# Patient Record
Sex: Female | Born: 1985 | Hispanic: Yes | Marital: Married | Smoking: Never smoker
Health system: Southern US, Community
[De-identification: ages and names within clinical notes are randomized; demographics above are authoritative.]

---

## 2006-10-30 ENCOUNTER — Encounter: Payer: Self-pay | Admitting: Maternal & Fetal Medicine

## 2006-11-07 ENCOUNTER — Observation Stay: Payer: Self-pay

## 2006-11-18 ENCOUNTER — Other Ambulatory Visit: Payer: Self-pay

## 2006-11-18 ENCOUNTER — Observation Stay: Payer: Self-pay | Admitting: Unknown Physician Specialty

## 2007-01-21 ENCOUNTER — Observation Stay: Payer: Self-pay | Admitting: Obstetrics & Gynecology

## 2007-01-25 ENCOUNTER — Observation Stay: Payer: Self-pay | Admitting: Obstetrics & Gynecology

## 2007-01-29 ENCOUNTER — Observation Stay: Payer: Self-pay

## 2007-01-31 ENCOUNTER — Inpatient Hospital Stay: Payer: Self-pay | Admitting: Unknown Physician Specialty

## 2008-08-16 ENCOUNTER — Emergency Department: Payer: Self-pay | Admitting: Emergency Medicine

## 2008-09-05 ENCOUNTER — Emergency Department: Payer: Self-pay | Admitting: Unknown Physician Specialty

## 2009-04-29 ENCOUNTER — Ambulatory Visit: Payer: Self-pay

## 2009-06-18 ENCOUNTER — Observation Stay: Payer: Self-pay

## 2009-06-19 ENCOUNTER — Ambulatory Visit: Payer: Self-pay | Admitting: Unknown Physician Specialty

## 2009-06-19 ENCOUNTER — Observation Stay: Payer: Self-pay | Admitting: Unknown Physician Specialty

## 2009-06-30 ENCOUNTER — Observation Stay: Payer: Self-pay

## 2009-07-22 ENCOUNTER — Ambulatory Visit: Payer: Self-pay | Admitting: Obstetrics and Gynecology

## 2009-07-23 ENCOUNTER — Inpatient Hospital Stay: Payer: Self-pay | Admitting: Obstetrics & Gynecology

## 2010-07-26 ENCOUNTER — Emergency Department: Payer: Self-pay | Admitting: Emergency Medicine

## 2011-01-03 ENCOUNTER — Emergency Department: Payer: Self-pay | Admitting: Unknown Physician Specialty

## 2011-09-23 ENCOUNTER — Emergency Department: Payer: Self-pay | Admitting: Unknown Physician Specialty

## 2011-09-23 LAB — CBC WITH DIFFERENTIAL/PLATELET
Basophil #: 0 10*3/uL (ref 0.0–0.1)
Basophil %: 0.4 %
Eosinophil #: 0.3 10*3/uL (ref 0.0–0.7)
Monocyte #: 0.3 x10 3/mm (ref 0.2–0.9)
Neutrophil #: 5 10*3/uL (ref 1.4–6.5)
Neutrophil %: 61.2 %

## 2011-09-23 LAB — COMPREHENSIVE METABOLIC PANEL
Alkaline Phosphatase: 74 U/L (ref 50–136)
BUN: 9 mg/dL (ref 7–18)
Bilirubin,Total: 0.7 mg/dL (ref 0.2–1.0)
Chloride: 104 mmol/L (ref 98–107)
Co2: 29 mmol/L (ref 21–32)
EGFR (African American): >60
EGFR (Non-African Amer.): >60
Potassium: 3.7 mmol/L (ref 3.5–5.1)
SGOT(AST): 24 U/L (ref 15–37)
Sodium: 141 mmol/L (ref 136–145)

## 2011-10-26 ENCOUNTER — Ambulatory Visit: Payer: Self-pay | Admitting: Gastroenterology

## 2011-10-26 LAB — PREGNANCY, URINE: Pregnancy Test, Urine: NEGATIVE m[IU]/mL

## 2012-04-16 ENCOUNTER — Emergency Department: Payer: Self-pay | Admitting: Emergency Medicine

## 2012-10-12 ENCOUNTER — Emergency Department: Payer: Self-pay | Admitting: Emergency Medicine

## 2013-07-24 ENCOUNTER — Emergency Department: Payer: Self-pay | Admitting: Emergency Medicine

## 2014-07-03 ENCOUNTER — Emergency Department: Payer: Self-pay | Admitting: Emergency Medicine

## 2015-08-14 ENCOUNTER — Emergency Department
Admission: EM | Admit: 2015-08-14 | Discharge: 2015-08-14 | Disposition: A | Discharge status: Home / Self Care | Payer: Self-pay | Attending: Emergency Medicine | Admitting: Emergency Medicine

## 2015-08-14 ENCOUNTER — Encounter: Payer: Self-pay | Admitting: *Deleted

## 2015-08-14 ENCOUNTER — Emergency Department: Payer: Self-pay

## 2015-08-14 DIAGNOSIS — K5901 Slow transit constipation: Secondary | ICD-10-CM | POA: Insufficient documentation

## 2015-08-14 LAB — COMPREHENSIVE METABOLIC PANEL
ALK PHOS: 49 U/L (ref 38–126)
ALT: 16 U/L (ref 14–54)
AST: 34 U/L (ref 15–41)
Albumin: 4.1 g/dL (ref 3.5–5.0)
Anion gap: 7 (ref 5–15)
BILIRUBIN TOTAL: 0.5 mg/dL (ref 0.3–1.2)
BUN: 10 mg/dL (ref 6–20)
CALCIUM: 9 mg/dL (ref 8.9–10.3)
CO2: 26 mmol/L (ref 22–32)
CREATININE: 0.82 mg/dL (ref 0.44–1.00)
Chloride: 104 mmol/L (ref 101–111)
Glucose, Bld: 90 mg/dL (ref 65–99)
Potassium: 3.6 mmol/L (ref 3.5–5.1)
Sodium: 137 mmol/L (ref 135–145)
Total Protein: 7.6 g/dL (ref 6.5–8.1)

## 2015-08-14 LAB — CBC
HEMATOCRIT: 31.1 % — AB (ref 35.0–47.0)
HEMOGLOBIN: 10.3 g/dL — AB (ref 12.0–16.0)
MCH: 22 pg — AB (ref 26.0–34.0)
MCHC: 33 g/dL (ref 32.0–36.0)
MCV: 66.6 fL — AB (ref 80.0–100.0)
Platelets: 381 10*3/uL (ref 150–440)
RBC: 4.67 MIL/uL (ref 3.80–5.20)
RDW: 18.9 % — ABNORMAL HIGH (ref 11.5–14.5)
WBC: 10.1 10*3/uL (ref 3.6–11.0)

## 2015-08-14 LAB — POCT PREGNANCY, URINE: Preg Test, Ur: NEGATIVE

## 2015-08-14 LAB — LIPASE, BLOOD: LIPASE: 33 U/L (ref 11–51)

## 2015-08-14 MED ORDER — MAGNESIUM CITRATE PO SOLN
1.0000 | Freq: Once | ORAL | Status: AC
  Administered 2015-08-14: 1 via ORAL

## 2015-08-14 MED ORDER — ONDANSETRON HCL 4 MG/2ML IJ SOLN
4.0000 mg | Freq: Once | INTRAMUSCULAR | Status: AC
  Administered 2015-08-14: 4 mg via INTRAVENOUS

## 2015-08-14 MED ORDER — MAGNESIUM CITRATE PO SOLN
ORAL | Status: AC
  Administered 2015-08-14: 1 via ORAL
  Filled 2015-08-14: qty 296

## 2015-08-14 MED ORDER — FLEET ENEMA 7-19 GM/118ML RE ENEM
1.0000 | ENEMA | Freq: Once | RECTAL | Status: AC
  Administered 2015-08-14: 1 via RECTAL

## 2015-08-14 MED ORDER — TRAMADOL HCL 50 MG PO TABS: 50.0000 mg | ORAL_TABLET | Freq: Four times a day (QID) | ORAL | Status: AC | PRN

## 2015-08-14 MED ORDER — ONDANSETRON HCL 4 MG/2ML IJ SOLN
INTRAMUSCULAR | Status: AC
  Administered 2015-08-14: 4 mg via INTRAVENOUS
  Filled 2015-08-14: qty 2

## 2015-08-14 NOTE — ED Notes (Signed)
Pt to ED with generalized abd pain, more so right sided. Pt states "been so many days" since last BM. Pt states hx of chronic constipation issues and feels similar. Pt AAOx4 upon arrival. Vitals stable, NAD noted.

## 2015-08-14 NOTE — ED Notes (Signed)
Patient transported to X-ray at this time 

## 2015-08-14 NOTE — ED Notes (Signed)
Pt unsuccessful with any BM, pt uncomfortable, requesting to go home. MD Cyril LoosenKinner made aware.

## 2015-08-14 NOTE — ED Provider Notes (Signed)
[facility] Emergency Department Provider Note  ____________________________________________    I have reviewed the triage vital signs and the nursing notes.   HISTORY  Chief Complaint Abdominal Pain    HPI Shannon Ferrell is a 30 y.o. female who presents with complaints of generalized abdominal pain which she attributed to constipation. Patient reports a long history of constipation. She notes she only has a few bowel movements per month. She no longer takes stool softeners because she says she does not want to become dependent on them. She denies nausea or vomiting. She is passing flatus. No fevers or chills.     History reviewed. No pertinent past medical history.  There are no active problems to display for this patient.   History reviewed. No pertinent past surgical history.  Current Outpatient Rx  Name  Route  Sig  Dispense  Refill  . traMADol (ULTRAM) 50 MG tablet   Oral   Take 1 tablet (50 mg total) by mouth every 6 (six) hours as needed.   20 tablet   0     Allergies Review of patient's allergies indicates no known allergies.  History reviewed. No pertinent family history.  Social History Social History  Substance Use Topics  . Smoking status: Never Smoker   . Smokeless tobacco: None  . Alcohol Use: No    Review of Systems  Constitutional: Negative for fever. Eyes: Negative for redness  Cardiovascular: Negative for chest pain Respiratory: Negative for cough Gastrointestinal: As above Genitourinary: Negative for dysuria. Musculoskeletal: Negative for back pain. Skin: Negative for rash. Neurological: Negative for focal weakness Psychiatric positive for anxiety    ____________________________________________   PHYSICAL EXAM:  VITAL SIGNS: ED Triage Vitals  Enc Vitals Group     BP 08/14/15 1813 106/67 mmHg     Pulse Rate 08/14/15 1813 74     Resp 08/14/15 1813 18     Temp 08/14/15 1813 98.4 F (36.9 C)   Temp Source 08/14/15 1813 Oral     SpO2 08/14/15 1813 99 %     Weight 08/14/15 1813 124 lb (56.246 kg)     Height 08/14/15 1813 5\' 2"  (1.575 m)     Head Cir --      Peak Flow --      Pain Score 08/14/15 1833 8     Pain Loc --      Pain Edu? --      Excl. in GC? --      Constitutional: Alert and oriented. Well appearing but anxious Eyes: Conjunctivae are normal. No erythema or injection ENT   Head: Normocephalic and atraumatic.   Mouth/Throat: Mucous membranes are moist. Cardiovascular: Normal rate, regular rhythm.  Respiratory: Normal respiratory effort without tachypnea nor retractions. B Gastrointestinal: Soft and non-tender in all quadrants. No distention. There is no CVA tenderness. Genitourinary: deferred Musculoskeletal: Nontender with normal range of motion in all extremities. No lower extremity tenderness nor edema. Neurologic:  Normal speech and language. No gross focal neurologic deficits are appreciated. Skin:  Skin is warm, dry and intact. No rash noted. Psychiatric: Mood and affect are normal. Patient exhibits appropriate insight and judgment.  ____________________________________________    LABS (pertinent positives/negatives)  Labs Reviewed  CBC - Abnormal; Notable for the following:    Hemoglobin 10.3 (*)    HCT 31.1 (*)    MCV 66.6 (*)    MCH 22.0 (*)    RDW 18.9 (*)    All other components within normal limits  COMPREHENSIVE METABOLIC  PANEL  LIPASE, BLOOD  POCT PREGNANCY, URINE    ____________________________________________   EKG  None  ____________________________________________    RADIOLOGY  X-ray shows significant constipation  ____________________________________________   PROCEDURES  Procedure(s) performed: none  Critical Care performed:none  ____________________________________________   INITIAL IMPRESSION / ASSESSMENT AND PLAN / ED COURSE  Pertinent labs & imaging results that were available during my care of  the patient were reviewed by me and considered in my medical decision making (see chart for details).  X-ray shows significant constipation. I suspect this is the cause of her diffuse abdominal discomfort and she feels this as well given her experience. We will try an enema and by mouth magnesium citrate.  Enema was unsuccessful. Patient drank all the magnesium citrate and is complaining of nausea. Zofran given  Patient has not had a bowel movement but she is no longer willing to stay in the emergency department. She wants to go home where she will continue to try. She reports she feels she will eventually be able to go to the bathroom. I reexamined her and I feel that she is safe for discharge. She knows to return if worsening abdominal pain. I referred her to GI given her severe chronic constipation and I strongly recommended taking daily stool softeners and increasing fiber  ____________________________________________   FINAL CLINICAL IMPRESSION(S) / ED DIAGNOSES  Final diagnoses:  Slow transit constipation          Jene Every, MD 08/14/15 2209

## 2015-08-14 NOTE — ED Notes (Signed)
MD Kinner at bedside  

## 2015-08-14 NOTE — Discharge Instructions (Signed)
Estreimiento - Adultos (Constipation, Adult) Estreimiento significa que una persona tiene menos de tres evacuaciones en una semana, dificultad para defecar, o que las heces son secas, duras, o ms grandes que lo normal. A medida que envejecemos el estreimiento es ms comn. Una dieta baja en fibra, no tomar suficientes lquidos y el uso de ciertos medicamentos pueden Scientist, research (life sciences)empeorar el estreimiento.  CAUSAS   Ciertos medicamentos, como los antidepresivos, analgsicos, suplementos de hierro, anticidos y diurticos.  Algunas enfermedades, como la diabetes, el sndrome del colon irritable, enfermedad de la tiroides, o depresin.  No beber suficiente agua.  No consumir suficientes alimentos ricos en fibra.  Situaciones de estrs o viajes.  Falta de actividad fsica o de ejercicio.  Ignorar la necesidad sbita de Advertising copywriterdefecar.  Uso en exceso de laxantes. SIGNOS Y SNTOMAS   Defecar menos de tres veces por semana.  Dificultad para defecar.  Tener las heces secas y duras, o ms grandes que las normales.  Sensacin de estar lleno o hinchado.  Dolor en la parte baja del abdomen.  No sentir alivio despus de defecar. DIAGNSTICO  El mdico le har una historia clnica y un examen fsico. Pueden hacerle exmenes adicionales para el estreimiento grave. Estos estudios pueden ser:  Un radiografa con enema de bario para examinar el recto, el colon y, en algunos casos, el intestino delgado.  Una sigmoidoscopia para examinar el colon inferior.  Una colonoscopia para examinar todo el colon. TRATAMIENTO  El tratamiento depender de la gravedad del estreimiento y de la causa. Algunos tratamientos nutricionales son beber ms lquidos y comer ms alimentos ricos en fibra. El cambio en el estilo de vida incluye hacer ejercicios de [place] regular. Si estas recomendaciones para Public relations account executiverealizar cambios en la dieta y en el estilo de vida no ayudan, el mdico le puede indicar el uso de laxantes de venta libre  para ayudarlo a Advertising copywriterdefecar. Los medicamentos recetados se pueden prescribir si los medicamentos de venta libre no lo [place]udan.  INSTRUCCIONES PARA EL CUIDADO EN EL HOGAR   Consuma alimentos con alto contenido de [place] Syracusefibra, como frutas, vegetales, cereales integrales y porotos.  Limite los alimentos procesados ricos en grasas y azcar, como las papas fritas, hamburguesas, galletas, dulces y refrescos.  Puede agregar un suplemento de fibra a su dieta si no obtiene lo suficiente de los alimentos.  Beba suficiente lquido para Photographermantener la orina clara o de color amarillo plido.  Haga ejercicio regularmente o segn las indicaciones del mdico.  Vaya al bao cuando sienta la necesidad de ir. No se aguante las ganas.  Tome solo medicamentos de venta libre o recetados, segn las indicaciones del mdico. No tome otros medicamentos para el estreimiento sin consultarlo antes con su mdico. SOLICITE ATENCIN MDICA DE INMEDIATO SI:   Observa sangre brillante en las heces.  El estreimiento dura ms de 4 das o Coachellaempeora.  Siente dolor abdominal o rectal.  Las heces son delgadas como un lpiz.  Pierde peso de [place] inexplicable. ASEGRESE DE QUE:   Comprende estas instrucciones.  Controlar su afeccin.  Recibir ayuda de inmediato si no mejora o si empeora.   Esta informacin no tiene Theme park managercomo fin reemplazar el consejo del mdico. Asegrese de hacerle al mdico cualquier pregunta que tenga.   Document Released: 04/24/2007 Document Revised: 04/25/2014 Elsevier Interactive Patient Education 2016 [employer]  Dieta rica en fibra (High-Fiber Diet) Minta BalsamLa fibra, tambin llamada fibra dietaria, es un tipo de carbohidrato que se encuentra en las frutas, las verduras, los cereales integrales y los frijoles.  Una dieta rica en fibra puede tener muchos beneficios para la salud. El mdico puede recomendar una dieta rica en fibra para ayudar a:  Chief Strategy Officer. La fibra puede hacer que defeque con ms  frecuencia.  Disminuir el nivel de colesterol.  Aliviar las hemorroides, la diverticulosis no complicada o el sndrome del intestino irritable.  Evitar comer en exceso como parte de un plan para bajar de peso.  Evitar cardiopatas, la diabetes tipo 2 y ciertos cnceres. EN QU CONSISTE EL PLAN? El consumo diario recomendado de fibra incluye lo siguiente:  38gramos para hombres menores de 50 aos.  30gramos para hombres mayores de [place].  25gramos para mujeres menores de 50 aos.  21gramos para mujeres mayores de [place]. Puede lograr el consumo diario recomendado de fibra si come una variedad de frutas, verduras, cereales y frijoles. El mdico tambin puede recomendar un suplemento de fibra si no es posible obtener suficiente fibra a travs de la dieta. QU DEBO SABER ACERCA DE LA DIETA RICA EN FIBRA?  La eficacia de los suplementos de [place] no ha sido estudiada [place], de modo que es mejor obtener fibra a travs de los alimentos.  Verifique siempre el contenido de fibra en la etiqueta de informacin nutricional de los alimentos preenvasados. Busque alimentos que contengan al menos 5gramos de fibra por porcin.  Consulte al nutricionista si tiene preguntas sobre algunos alimentos especficos relacionados con su enfermedad, especialmente si estos alimentos no se mencionan a continuacin.  Aumente el consumo diario de fibra en forma gradual. Aumentar demasiado rpido el consumo de fibra dietaria puede provocar meteorismo, clicos o gases.  Beber abundante agua. El [place] a Geophysicist/field seismologist. QU ALIMENTOS PUEDO COMER? Cereales Panes integrales. Multicereales. Avena. Arroz integral. Gypsy Decant. Trigo burgol. Mijo. Muffins de salvado. Palomitas de maz. Galletas de centeno. Verduras Batatas. Espinaca. Col rizada. Alcachofas. Repollo. Brcoli. Guisantes. Zanahorias. [place]. Frutas Frutos rojos. Peras. Manzanas. Naranjas Aguacates. Ciruelas y pasas. Higos secos. Carnes  y otras fuentes de protenas Frijoles blancos, colorados, pintos y porotos de soja. Guisantes secos. Lentejas. Frutos secos y semillas. Lcteos Yogur fortificado con Research scientist (life sciences). Bebidas Leche de soja fortificada con Bjorn Loser. Jugo de naranja fortificado con Bjorn Loser. Otros Barras de [place]. Los artculos mencionados arriba pueden no ser [employer] de las bebidas o los alimentos recomendados. Comunquese con el nutricionista para conocer ms opciones. QU ALIMENTOS NO SE RECOMIENDAN? Cereales Pan blanco. Pastas hechas con Webb Laws. Arroz blanco. Verduras Papas fritas. Verduras enlatadas. Verduras bien cocidas.  Frutas Jugo de frutas. Frutas cocidas coladas. Carnes y [address] fuentes de protenas Cortes de carne con Holiday representative. Aves o pescados fritos. Lcteos Leche. Yogur. Queso crema. [employer]. Bebidas Gaseosas. Otros Tortas y pasteles. Mantequilla y aceites. Los artculos mencionados arriba pueden no ser [employer] de las bebidas y los alimentos que se Theatre stage manager. Comunquese con el nutricionista para obtener ms informacin. ALGUNOS CONSEJOS PARA INCLUIR ALIMENTOS RICOS EN FIBRA EN LA DIETA  Consuma una gran variedad de alimentos ricos en fibra.  Asegrese de que la mitad de todos los cereales consumidos cada da sean cereales integrales.  Reemplace los panes y cereales hechos de harina refinada o harina blanca por panes y cereales integrales.  Reemplace el arroz blanco por arroz integral, trigo burgol o mijo.  Comience Medical laboratory scientific officer con un desayuno rico en [place], como un cereal que contenga al menos 5gramos de fibra por porcin.  Use guisantes en lugar de carne en las sopas, ensaladas o pastas.  Coma bocadillos  ricos en fibra, como frutos rojos, verduras crudas, frutos secos o palomitas de maz.   Esta informacin no tiene Theme park manager el consejo del mdico. Asegrese de hacerle al mdico cualquier pregunta que tenga.   Document Released: 04/04/2005 Document  Revised: 04/25/2014 Elsevier Interactive Patient Education [employer]

## 2015-08-25 NOTE — ED Provider Notes (Signed)
ED ECG REPORT I, Jene EveryKINNER, Shearon Clonch, the attending physician, personally viewed and interpreted this ECG.  Date: 08/25/2015 EKG Time: 6:25 PM Rate: 66 Rhythm: normal sinus rhythm QRS Axis: normal Intervals: normal ST/T Wave abnormalities: normal Conduction Disturbances: none Narrative Interpretation: unremarkable   Jene Everyobert Gurveer Colucci, MD 08/25/15 1427

## 2015-12-28 ENCOUNTER — Emergency Department
Admission: EM | Admit: 2015-12-28 | Discharge: 2015-12-28 | Disposition: A | Discharge status: Home / Self Care | Payer: Self-pay | Attending: Student in an Organized Health Care Education/Training Program | Admitting: Student in an Organized Health Care Education/Training Program

## 2015-12-28 ENCOUNTER — Encounter: Payer: Self-pay | Admitting: Emergency Medicine

## 2015-12-28 DIAGNOSIS — T7840XA Allergy, unspecified, initial encounter: Secondary | ICD-10-CM | POA: Insufficient documentation

## 2015-12-28 MED ORDER — METHYLPREDNISOLONE 4 MG PO TBPK: ORAL_TABLET | ORAL | 0 refills | Status: DC

## 2015-12-28 MED ORDER — DEXAMETHASONE SODIUM PHOSPHATE 10 MG/ML IJ SOLN
10.0000 mg | Freq: Once | INTRAMUSCULAR | Status: AC
  Administered 2015-12-28: 10 mg via INTRAMUSCULAR
  Filled 2015-12-28: qty 1

## 2015-12-28 MED ORDER — HYDROXYZINE HCL 50 MG PO TABS: 50.0000 mg | ORAL_TABLET | Freq: Three times a day (TID) | ORAL | 0 refills | Status: DC | PRN

## 2015-12-28 NOTE — ED Notes (Signed)
See triage note states she developed periorbital swelling at the end of August  Was seen and placed on prednisone  States sx's became better but then returned  States sx's become worse during the day

## 2015-12-28 NOTE — ED Triage Notes (Signed)
Visual acuity R eye 20/25, L eye 20/30.

## 2015-12-28 NOTE — Discharge Instructions (Signed)
Take medication as directed and follow-up with [facility] if no improvement in 3 days.

## 2015-12-28 NOTE — ED Triage Notes (Signed)
Bilateral periorbital edema since 8/30. Has taken prednisone and hydroxyzine. States eyes burning.

## 2015-12-28 NOTE — ED Provider Notes (Signed)
[facility] Emergency Department Provider Note   ____________________________________________   None    (approximate)  I have reviewed the triage vital signs and the nursing notes.   HISTORY  Chief Complaint Facial Swelling    HPI Shannon Ferrell is a 30 y.o. female patient complaining of bilateral 2 edema since 12/16/2015. Patient saw family doctor and was given a prescription for prednisone and hydroxyzine. Patient states no improvement. Patient stated as a burning sensation in her eyes. Patient denies any vision loss.   History reviewed. No pertinent past medical history.  There are no active problems to display for this patient.   Past Surgical History:  Procedure Laterality Date  . CESAREAN SECTION      Prior to Admission medications   Medication Sig Start Date End Date Taking? Authorizing Provider  hydrOXYzine (ATARAX/VISTARIL) 50 MG tablet Take 1 tablet (50 mg total) by mouth 3 (three) times daily as needed for itching. 12/28/15   Joni Reining, PA-C  methylPREDNISolone (MEDROL DOSEPAK) 4 MG TBPK tablet Take Tapered dose as directed 12/28/15   Joni Reining, PA-C  traMADol (ULTRAM) 50 MG tablet Take 1 tablet (50 mg total) by mouth every 6 (six) hours as needed. 08/14/15 08/13/16  Jene Every, MD    Allergies Review of patient's allergies indicates no known allergies.  No family history on file.  Social History Social History  Substance Use Topics  . Smoking status: Never Smoker  . Smokeless tobacco: Not on file  . Alcohol use No    Review of Systems Constitutional: No fever/chills Eyes: No visual changes. ENT: No sore throat. Cardiovascular: Denies chest pain. Respiratory: Denies shortness of breath. Gastrointestinal: No abdominal pain.  No nausea, no vomiting.  No diarrhea.  No constipation. Genitourinary: Negative for dysuria. Musculoskeletal: Negative for back pain. Skin: Positive for rash. Neurological: Negative for  headaches, focal weakness or numbness.    ____________________________________________   PHYSICAL EXAM:  VITAL SIGNS: ED Triage Vitals  Enc Vitals Group     BP 12/28/15 0714 110/69     Pulse Rate 12/28/15 0714 90     Resp 12/28/15 0714 18     Temp 12/28/15 0714 98.4 F (36.9 C)     Temp Source 12/28/15 0714 Oral     SpO2 12/28/15 0715 100 %     Weight 12/28/15 0715 122 lb (55.3 kg)     Height 12/28/15 0715 5\' 2"  (1.575 m)     Head Circumference --      Peak Flow --      Pain Score 12/28/15 0715 9     Pain Loc --      Pain Edu? --      Excl. in GC? --     Constitutional: Alert and oriented. Well appearing and in no acute distress. Eyes: Conjunctivae are normal. PERRL. EOMI. Head: Atraumatic. Nose: No congestion/rhinnorhea. Mouth/Throat: Mucous membranes are moist.  Oropharynx non-erythematous. Neck: No stridor.  No cervical spine tenderness to palpation. Hematological/Lymphatic/Immunilogical: No cervical lymphadenopathy. Cardiovascular: Normal rate, regular rhythm. Grossly normal heart sounds.  Good peripheral circulation. Respiratory: Normal respiratory effort.  No retractions. Lungs CTAB. Gastrointestinal: Soft and nontender. No distention. No abdominal bruits. No CVA tenderness. Musculoskeletal: No lower extremity tenderness nor edema.  No joint effusions. Neurologic:  Normal speech and language. No gross focal neurologic deficits are appreciated. No gait instability. Skin:  Skin is warm, dry and intact. Vascular lesion on erythematous base periorbital area bilaterally. Psychiatric: Mood and affect are normal. Speech  and behavior are normal.  ____________________________________________   LABS (all labs ordered are listed, but only abnormal results are displayed)  Labs Reviewed - No data to  display ____________________________________________  EKG   ____________________________________________  RADIOLOGY   ____________________________________________   PROCEDURES  Procedure(s) performed: None  Procedures  Critical Care performed: No  ____________________________________________   INITIAL IMPRESSION / ASSESSMENT AND PLAN / ED COURSE  Pertinent labs & imaging results that were available during my care of the patient were reviewed by me and considered in my medical decision making (see chart for details).  Periorbital edema. Viral component. Patient given a prescription for Motrin dosepak and Atarax. Patient advised to follow-up with ophthalmology clinic if no improvement in 2 days.  Clinical Course     ____________________________________________   FINAL CLINICAL IMPRESSION(S) / ED DIAGNOSES  Final diagnoses:  Allergic reaction, initial encounter      NEW MEDICATIONS STARTED DURING THIS VISIT:  New Prescriptions   HYDROXYZINE (ATARAX/VISTARIL) 50 MG TABLET    Take 1 tablet (50 mg total) by mouth 3 (three) times daily as needed for itching.   METHYLPREDNISOLONE (MEDROL DOSEPAK) 4 MG TBPK TABLET    Take Tapered dose as directed     Note:  This document was prepared using Dragon voice recognition software and may include unintentional dictation errors.    Joni Reiningonald K Smith, PA-C 12/28/15 [id number]    Willy EddyPatrick Robinson, MD 12/28/15 [phone]

## 2017-09-12 ENCOUNTER — Emergency Department: Payer: Self-pay

## 2017-09-12 ENCOUNTER — Encounter: Payer: Self-pay | Admitting: Emergency Medicine

## 2017-09-12 ENCOUNTER — Emergency Department
Admission: EM | Admit: 2017-09-12 | Discharge: 2017-09-12 | Disposition: A | Discharge status: Home / Self Care | Payer: Self-pay | Attending: Emergency Medicine | Admitting: Emergency Medicine

## 2017-09-12 DIAGNOSIS — M79641 Pain in right hand: Secondary | ICD-10-CM | POA: Insufficient documentation

## 2017-09-12 DIAGNOSIS — Y999 Unspecified external cause status: Secondary | ICD-10-CM | POA: Insufficient documentation

## 2017-09-12 DIAGNOSIS — Y9234 Swimming pool (public) as the place of occurrence of the external cause: Secondary | ICD-10-CM | POA: Insufficient documentation

## 2017-09-12 DIAGNOSIS — Y9311 Activity, swimming: Secondary | ICD-10-CM | POA: Insufficient documentation

## 2017-09-12 DIAGNOSIS — S060X0A Concussion without loss of consciousness, initial encounter: Secondary | ICD-10-CM | POA: Insufficient documentation

## 2017-09-12 DIAGNOSIS — W0110XA Fall on same level from slipping, tripping and stumbling with subsequent striking against unspecified object, initial encounter: Secondary | ICD-10-CM | POA: Insufficient documentation

## 2017-09-12 MED ORDER — BUTALBITAL-APAP-CAFFEINE 50-325-40 MG PO TABS: 1.0000 | ORAL_TABLET | Freq: Three times a day (TID) | ORAL | 0 refills | Status: AC | PRN

## 2017-09-12 MED ORDER — IBUPROFEN 800 MG PO TABS: 800.0000 mg | ORAL_TABLET | Freq: Three times a day (TID) | ORAL | 0 refills | Status: DC | PRN

## 2017-09-12 MED ORDER — KETOROLAC TROMETHAMINE 30 MG/ML IJ SOLN
INTRAMUSCULAR | Status: AC
  Filled 2017-09-12: qty 1

## 2017-09-12 MED ORDER — BUTALBITAL-APAP-CAFFEINE 50-325-40 MG PO TABS
1.0000 | ORAL_TABLET | Freq: Once | ORAL | Status: AC
  Administered 2017-09-12: 1 via ORAL
  Filled 2017-09-12: qty 1

## 2017-09-12 MED ORDER — KETOROLAC TROMETHAMINE 30 MG/ML IJ SOLN
30.0000 mg | Freq: Once | INTRAMUSCULAR | Status: AC
  Administered 2017-09-12: 30 mg via INTRAMUSCULAR

## 2017-09-12 NOTE — ED Triage Notes (Signed)
Patient presents to the ED post fall outside the pool yesterday.  Patient states she slipped and fell and hit her head.  Patient states she did not lose consciousness but after the fall she felt somewhat dizzy and nauseous.  Patient reports headache and photophobia since the fall.  Patient is also complaining of left elbow pain and right hand pain from fall.  Patient is in no obvious distress at this time.

## 2017-09-12 NOTE — ED Provider Notes (Signed)
[facility] Emergency Department Provider Note  ____________________________________________  Time seen: Approximately 7:46 PM  I have reviewed the triage vital signs and the nursing notes.   HISTORY  Chief Complaint Headache and Photophobia    HPI Shannon Ferrell is a 32 y.o. female that presents to the emergency department for evaluation of headache and photophobia after injury yesterday. She was at the pool and fell and hit her head. When she first got out of the pool, she felt dizzy. She originally had some nausea but no vomiting. Headache has improved mildly since yesterday. She describes headache as throbbing. She is still having sensitivity to bright lights. She is also having hand pain where she reached out to grab pool. She did not loose consciousness.  She has taken tylenol and ibuprofen for pain. No history of headaches.   History reviewed. No pertinent past medical history.  There are no active problems to display for this patient.   Past Surgical History:  Procedure Laterality Date  . CESAREAN SECTION      Prior to Admission medications   Medication Sig Start Date End Date Taking? Authorizing Provider  butalbital-acetaminophen-caffeine (FIORICET, ESGIC) [phone] MG tablet Take 1-2 tablets by mouth every 8 (eight) hours as needed for up to 4 days for headache. 09/12/17 09/16/17  Enid Derry, PA-C  hydrOXYzine (ATARAX/VISTARIL) 50 MG tablet Take 1 tablet (50 mg total) by mouth 3 (three) times daily as needed for itching. 12/28/15   Joni Reining, PA-C  ibuprofen (ADVIL,MOTRIN) 800 MG tablet Take 1 tablet (800 mg total) by mouth every 8 (eight) hours as needed. 09/12/17   Enid Derry, PA-C  methylPREDNISolone (MEDROL DOSEPAK) 4 MG TBPK tablet Take Tapered dose as directed 12/28/15   Joni Reining, PA-C    Allergies Patient has no known allergies.  No family history on file.  Social History Social History   Tobacco Use  . Smoking  status: Never Smoker  . Smokeless tobacco: Never Used  Substance Use Topics  . Alcohol use: No  . Drug use: Not on file     Review of Systems  Constitutional: No fever/chills Cardiovascular: No chest pain. Respiratory: No SOB. Gastrointestinal: No abdominal pain.  No nausea, no vomiting.  Musculoskeletal: Positive for hand pain.  Skin: Negative for rash, abrasions, lacerations, ecchymosis.   ____________________________________________   PHYSICAL EXAM:  VITAL SIGNS: ED Triage Vitals  Enc Vitals Group     BP 09/12/17 1740 119/69     Pulse Rate 09/12/17 1740 69     Resp 09/12/17 1740 16     Temp 09/12/17 1740 98.5 F (36.9 C)     Temp Source 09/12/17 1740 Oral     SpO2 09/12/17 1740 100 %     Weight --      Height --      Head Circumference --      Peak Flow --      Pain Score 09/12/17 1856 5     Pain Loc --      Pain Edu? --      Excl. in GC? --      Constitutional: Alert and oriented. Well appearing and in no acute distress. Eyes: Conjunctivae are normal. PERRL. EOMI. Head: Atraumatic. ENT:      Ears:      Nose: No congestion/rhinnorhea.      Mouth/Throat: Mucous membranes are moist.  Neck: No stridor.  Cardiovascular: Normal rate, regular rhythm.  Good peripheral circulation. Respiratory: Normal respiratory effort without tachypnea or  retractions. Lungs CTAB. Good air entry to the bases with no decreased or absent breath sounds. Musculoskeletal: Full range of motion to all extremities. No gross deformities appreciated. Pain over volar hand near wrist. Neurologic: Normal speech and language. No gross focal neurologic deficits are appreciated.  Cranial nerves: 2-10 normal as tested. Strength 5/5 in upper and lower extremities Cerebellar: Finger-nose-finger WNL, Heel to shin WNL Sensorimotor: No pronator drift, clonus, sensory loss or abnormal reflexes. No vision deficits noted to confrontation bilaterally.  Speech: No dysarthria or expressive aphasia Skin:   Skin is warm, dry and intact. No rash noted.   ____________________________________________   LABS (all labs ordered are listed, but only abnormal results are displayed)  Labs Reviewed - No data to display ____________________________________________  EKG   ____________________________________________  RADIOLOGY   Ct Head Wo Contrast  Result Date: 09/12/2017 CLINICAL DATA:  Fall, headache EXAM: CT HEAD WITHOUT CONTRAST TECHNIQUE: Contiguous axial images were obtained from the base of the skull through the vertex without intravenous contrast. COMPARISON:  CT head 09/05/2008 FINDINGS: Brain: No evidence of acute infarction, hemorrhage, hydrocephalus, extra-axial collection or mass lesion/mass effect. Vascular: Negative for hyperdense vessel Skull: Negative for fracture Sinuses/Orbits: Negative Other: None IMPRESSION: Negative CT head Electronically Signed   By: Marlan Palau M.D.   On: 09/12/2017 20:45   Dg Hand Complete Right  Result Date: 09/12/2017 CLINICAL DATA:  Fall, injury EXAM: RIGHT HAND - COMPLETE 3+ VIEW COMPARISON:  None. FINDINGS: There is no evidence of fracture or dislocation. There is no evidence of arthropathy or other focal bone abnormality. Soft tissues are unremarkable. IMPRESSION: Negative. Electronically Signed   By: Marlan Palau M.D.   On: 09/12/2017 20:43    ____________________________________________    PROCEDURES  Procedure(s) performed:    Procedures    Medications  butalbital-acetaminophen-caffeine (FIORICET, ESGIC) 50-325-40 MG per tablet 1 tablet (1 tablet Oral Given 09/12/17 2037)  ketorolac (TORADOL) 30 MG/ML injection 30 mg (30 mg Intramuscular Given 09/12/17 2110)     ____________________________________________   INITIAL IMPRESSION / ASSESSMENT AND PLAN / ED COURSE  Pertinent labs & imaging results that were available during my care of the patient were reviewed by me and considered in my medical decision making (see chart for  details).  Review of the [place] CSRS was performed in accordance of the [facility] prior to dispensing any controlled drugs.   Patient's diagnosis is consistent with concussion.  Vital signs and exam are reassuring.  Neuro exam within normal limits.  CT head negative for acute abnormalities.  Hand x-ray negative for fracture. Patient is in room texting a significant amount of time in room.  Patient will be discharged home with prescriptions for ibuprofen and Fioricet. Patient is to follow up with PCP and neurology as directed. Patient is given ED precautions to return to the ED for any worsening or new symptoms.     ____________________________________________  FINAL CLINICAL IMPRESSION(S) / ED DIAGNOSES  Final diagnoses:  Concussion without loss of consciousness, initial encounter  Pain of right hand      NEW MEDICATIONS STARTED DURING THIS VISIT:  ED Discharge Orders        Ordered    butalbital-acetaminophen-caffeine (FIORICET, ESGIC) 50-325-40 MG tablet  Every 8 hours PRN     09/12/17 2204    ibuprofen (ADVIL,MOTRIN) 800 MG tablet  Every 8 hours PRN     09/12/17 2204          This chart was dictated using voice recognition software/Dragon. Despite best efforts  to proofread, errors can occur which can change the meaning. Any change was purely unintentional.    Enid Derry, PA-C 09/12/17 2257    Dionne Bucy, MD 09/12/17 2342

## 2018-10-09 ENCOUNTER — Ambulatory Visit: Payer: Self-pay | Admitting: Family Medicine

## 2018-11-06 ENCOUNTER — Other Ambulatory Visit: Payer: Self-pay

## 2018-11-06 DIAGNOSIS — Z20822 Contact with and (suspected) exposure to covid-19: Secondary | ICD-10-CM

## 2018-11-08 LAB — NOVEL CORONAVIRUS, NAA: SARS-CoV-2, NAA: DETECTED — AB

## 2018-11-12 ENCOUNTER — Telehealth: Payer: Self-pay

## 2018-11-12 NOTE — Telephone Encounter (Signed)
Patient not.  Answering.

## 2018-11-16 ENCOUNTER — Other Ambulatory Visit: Payer: Self-pay

## 2018-11-16 DIAGNOSIS — Z20822 Contact with and (suspected) exposure to covid-19: Secondary | ICD-10-CM

## 2018-11-18 LAB — NOVEL CORONAVIRUS, NAA: SARS-CoV-2, NAA: NOT DETECTED

## 2018-11-21 ENCOUNTER — Telehealth: Payer: Self-pay | Admitting: General Practice

## 2018-11-21 NOTE — Telephone Encounter (Signed)
Pt called to get COVID results, made her aware those are negative and sent MyChart link

## 2018-12-07 ENCOUNTER — Other Ambulatory Visit: Payer: Self-pay

## 2018-12-07 DIAGNOSIS — Z20822 Contact with and (suspected) exposure to covid-19: Secondary | ICD-10-CM

## 2018-12-08 LAB — NOVEL CORONAVIRUS, NAA: SARS-CoV-2, NAA: NOT DETECTED

## 2018-12-25 ENCOUNTER — Other Ambulatory Visit: Payer: Self-pay

## 2018-12-25 DIAGNOSIS — Z20822 Contact with and (suspected) exposure to covid-19: Secondary | ICD-10-CM

## 2018-12-26 LAB — NOVEL CORONAVIRUS, NAA: SARS-CoV-2, NAA: NOT DETECTED

## 2018-12-27 ENCOUNTER — Telehealth: Payer: Self-pay | Admitting: General Practice

## 2018-12-27 NOTE — Telephone Encounter (Signed)
Negative COVID results given. Patient results "NOT Detected." Caller expressed understanding. ° °

## 2019-03-03 ENCOUNTER — Other Ambulatory Visit: Payer: Self-pay

## 2019-03-03 ENCOUNTER — Encounter: Payer: Self-pay | Admitting: Emergency Medicine

## 2019-03-03 ENCOUNTER — Emergency Department
Admission: EM | Admit: 2019-03-03 | Discharge: 2019-03-03 | Disposition: A | Discharge status: Home / Self Care | Payer: Medicaid Other | Attending: Emergency Medicine | Admitting: Emergency Medicine

## 2019-03-03 ENCOUNTER — Emergency Department: Payer: Medicaid Other

## 2019-03-03 DIAGNOSIS — Y999 Unspecified external cause status: Secondary | ICD-10-CM | POA: Insufficient documentation

## 2019-03-03 DIAGNOSIS — Y29XXXA Contact with blunt object, undetermined intent, initial encounter: Secondary | ICD-10-CM | POA: Insufficient documentation

## 2019-03-03 DIAGNOSIS — Y9289 Other specified places as the place of occurrence of the external cause: Secondary | ICD-10-CM | POA: Insufficient documentation

## 2019-03-03 DIAGNOSIS — S8982XA Other specified injuries of left lower leg, initial encounter: Secondary | ICD-10-CM | POA: Insufficient documentation

## 2019-03-03 DIAGNOSIS — Y9389 Activity, other specified: Secondary | ICD-10-CM | POA: Insufficient documentation

## 2019-03-03 DIAGNOSIS — S8992XA Unspecified injury of left lower leg, initial encounter: Secondary | ICD-10-CM

## 2019-03-03 MED ORDER — IBUPROFEN 800 MG PO TABS: 800.0000 mg | ORAL_TABLET | Freq: Three times a day (TID) | ORAL | 0 refills | Status: DC | PRN

## 2019-03-03 MED ORDER — KETOROLAC TROMETHAMINE 30 MG/ML IJ SOLN
30.0000 mg | Freq: Once | INTRAMUSCULAR | Status: AC
  Administered 2019-03-03: 30 mg via INTRAMUSCULAR
  Filled 2019-03-03: qty 1

## 2019-03-03 NOTE — ED Triage Notes (Signed)
Pt says yesterday she tripped over a bag and fell onto her left knee; c/o throbbing pain since; pt was offered a wheelchair but declined; tenderness to patella when palpated

## 2019-03-03 NOTE — ED Provider Notes (Signed)
[facility] Emergency Department Provider Note  ____________________________________________  Time seen: Approximately 7:59 AM  I have reviewed the triage vital signs and the nursing notes.   HISTORY  Chief Complaint Knee Pain    HPI Shannon Ferrell is a 33 y.o. female that presents to the emergency department for evaluation of knee injury.  Patient states that she was trying to jump over a bag last night and hit her knee on her car.  She has had mild pain with walking since.  She does not feel that anything is broken.  She would like a prescription for ibuprofen because she states that the 800 mg of prescription ibuprofen is more effective than taking for 200 mg OTC ibuprofens.  No additional injuries.  History reviewed. No pertinent past medical history.  There are no active problems to display for this patient.   Past Surgical History:  Procedure Laterality Date  . CESAREAN SECTION      Prior to Admission medications   Medication Sig Start Date End Date Taking? Authorizing Provider  ibuprofen (ADVIL) 800 MG tablet Take 1 tablet (800 mg total) by mouth every 8 (eight) hours as needed. 03/03/19   Enid Derry, PA-C    Allergies Patient has no known allergies.  History reviewed. No pertinent family history.  Social History Social History   Tobacco Use  . Smoking status: Never Smoker  . Smokeless tobacco: Never Used  Substance Use Topics  . Alcohol use: No  . Drug use: Never     Review of Systems  Gastrointestinal: No nausea, no vomiting.  Musculoskeletal: Positive for knee pain. Skin: Negative for abrasions, lacerations, ecchymosis.  Positive for rash. Neurological: Negative for numbness or tingling   ____________________________________________   PHYSICAL EXAM:  VITAL SIGNS: ED Triage Vitals  Enc Vitals Group     BP 03/03/19 0612 114/75     Pulse Rate 03/03/19 0612 93     Resp 03/03/19 0612 16     Temp 03/03/19 0612 99.4  F (37.4 C)     Temp Source 03/03/19 0612 Oral     SpO2 03/03/19 0612 98 %     Weight 03/03/19 0613 137 lb (62.1 kg)     Height 03/03/19 0613 5\' 2"  (1.575 m)     Head Circumference --      Peak Flow --      Pain Score 03/03/19 0613 7     Pain Loc --      Pain Edu? --      Excl. in GC? --      Constitutional: Alert and oriented. Well appearing and in no acute distress. Eyes: Conjunctivae are normal. PERRL. EOMI. Head: Atraumatic. ENT:      Ears:      Nose: No congestion/rhinnorhea.      Mouth/Throat: Mucous membranes are moist.  Neck: No stridor.   Cardiovascular: Normal rate, regular rhythm.  Good peripheral circulation. Respiratory: Normal respiratory effort without tachypnea or retractions. Lungs CTAB. Good air entry to the bases with no decreased or absent breath sounds. Musculoskeletal: Full range of motion to all extremities. No gross deformities appreciated.  Mild tenderness to palpation to mid patella.  Full range of motion of knee with minimal pain.  Normal gait. Neurologic:  Normal speech and language. No gross focal neurologic deficits are appreciated.  Skin:  Skin is warm, dry.  1/2 cm x 1/2 cm area of erythema to anterior patella. Psychiatric: Mood and affect are normal. Speech and behavior are normal. Patient  exhibits appropriate insight and judgement.   ____________________________________________   LABS (all labs ordered are listed, but only abnormal results are displayed)  Labs Reviewed - No data to display ____________________________________________  EKG   ____________________________________________  RADIOLOGY Robinette Haines, personally viewed and evaluated these images (plain radiographs) as part of my medical decision making, as well as reviewing the written report by the radiologist.  Dg Knee Complete 4 Views Left  Result Date: 03/03/2019 CLINICAL DATA:  Post fall yesterday now with anterior knee pain. EXAM: LEFT KNEE - COMPLETE 4+ VIEW  COMPARISON:  None. FINDINGS: No fracture or dislocation. Joint spaces are preserved. No evidence of chondrocalcinosis. No joint effusion. Note is made of a sclerotic lesion involving the posterior medial aspect of the distal femoral diaphysis without associated periostitis compatible within benign involuting NOF. Regional soft tissues appear normal. IMPRESSION: No explanation for patient's left knee pain. Electronically Signed   By: Sandi Mariscal M.D.   On: 03/03/2019 08:01    ____________________________________________    PROCEDURES  Procedure(s) performed:    Procedures    Medications  ketorolac (TORADOL) 30 MG/ML injection 30 mg (30 mg Intramuscular Given 03/03/19 3016)     ____________________________________________   INITIAL IMPRESSION / ASSESSMENT AND PLAN / ED COURSE  Pertinent labs & imaging results that were available during my care of the patient were reviewed by me and considered in my medical decision making (see chart for details).  Review of the [place] CSRS was performed in accordance of the [place] prior to dispensing any controlled drugs.     Patient presented to emergency department for evaluation of knee injury.  Vital signs and exam are reassuring.  X-ray negative for acute bony abnormalities.  Knee was Ace wrapped and crutches were given.  IM Toradol was given.  Patient will be discharged home with prescriptions for ibuprofen. Patient is to follow up with primary care as directed. Patient is given ED precautions to return to the ED for any worsening or new symptoms.  Shannon Ferrell was evaluated in Emergency Department on 03/03/2019 for the symptoms described in the history of present illness. She was evaluated in the context of the global COVID-19 pandemic, which necessitated consideration that the patient might be at risk for infection with the SARS-CoV-2 virus that causes COVID-19. Institutional protocols and algorithms that pertain to the evaluation of patients  at risk for COVID-19 are in a state of rapid change based on information released by regulatory bodies including the CDC and federal and state organizations. These policies and algorithms were followed during the patient's care in the ED.   ____________________________________________  FINAL CLINICAL IMPRESSION(S) / ED DIAGNOSES  Final diagnoses:  Injury of left knee, initial encounter      NEW MEDICATIONS STARTED DURING THIS VISIT:  ED Discharge Orders         Ordered    ibuprofen (ADVIL) 800 MG tablet  Every 8 hours PRN     03/03/19 0811              This chart was dictated using voice recognition software/Dragon. Despite best efforts to proofread, errors can occur which can change the meaning. Any change was purely unintentional.    Laban Emperor, PA-C 03/03/19 1425    Earleen Newport, MD 03/03/19 1432

## 2019-03-03 NOTE — ED Notes (Signed)
See triage note  States she tripped and fell    Landed on left knee   No deformity note

## 2019-04-24 ENCOUNTER — Other Ambulatory Visit: Payer: Medicaid Other

## 2019-05-20 ENCOUNTER — Ambulatory Visit: Payer: Self-pay

## 2019-05-20 ENCOUNTER — Ambulatory Visit: Payer: Medicaid Other | Attending: Internal Medicine

## 2019-05-20 DIAGNOSIS — Z20822 Contact with and (suspected) exposure to covid-19: Secondary | ICD-10-CM

## 2019-05-21 ENCOUNTER — Ambulatory Visit: Payer: Self-pay

## 2019-05-21 LAB — NOVEL CORONAVIRUS, NAA: SARS-CoV-2, NAA: NOT DETECTED

## 2019-05-29 ENCOUNTER — Ambulatory Visit: Payer: Medicaid Other | Attending: Internal Medicine

## 2019-05-29 DIAGNOSIS — Z20822 Contact with and (suspected) exposure to covid-19: Secondary | ICD-10-CM

## 2019-05-30 LAB — NOVEL CORONAVIRUS, NAA: SARS-CoV-2, NAA: NOT DETECTED

## 2019-06-24 ENCOUNTER — Ambulatory Visit: Payer: Medicaid Other | Attending: Internal Medicine

## 2019-06-24 DIAGNOSIS — Z20822 Contact with and (suspected) exposure to covid-19: Secondary | ICD-10-CM

## 2019-06-25 LAB — NOVEL CORONAVIRUS, NAA: SARS-CoV-2, NAA: NOT DETECTED

## 2019-08-28 ENCOUNTER — Ambulatory Visit: Payer: Medicaid Other | Attending: Internal Medicine

## 2019-08-28 DIAGNOSIS — Z20822 Contact with and (suspected) exposure to covid-19: Secondary | ICD-10-CM

## 2019-08-29 LAB — SARS-COV-2, NAA 2 DAY TAT

## 2019-08-29 LAB — NOVEL CORONAVIRUS, NAA: SARS-CoV-2, NAA: NOT DETECTED

## 2020-04-06 ENCOUNTER — Other Ambulatory Visit: Payer: Medicaid Other

## 2020-04-06 DIAGNOSIS — Z20822 Contact with and (suspected) exposure to covid-19: Secondary | ICD-10-CM

## 2020-04-07 ENCOUNTER — Ambulatory Visit: Payer: Self-pay | Admitting: *Deleted

## 2020-04-07 LAB — SARS-COV-2, NAA 2 DAY TAT

## 2020-04-07 LAB — NOVEL CORONAVIRUS, NAA: SARS-CoV-2, NAA: NOT DETECTED

## 2020-04-07 NOTE — Telephone Encounter (Signed)
Patient called for covid test results. No results noted at this time. Patient verbalized understanding .

## 2020-04-13 ENCOUNTER — Other Ambulatory Visit: Payer: Medicaid Other

## 2020-04-13 DIAGNOSIS — Z20822 Contact with and (suspected) exposure to covid-19: Secondary | ICD-10-CM

## 2020-04-15 ENCOUNTER — Telehealth: Payer: Self-pay

## 2020-04-15 LAB — NOVEL CORONAVIRUS, NAA: SARS-CoV-2, NAA: DETECTED — AB

## 2020-04-15 LAB — SARS-COV-2, NAA 2 DAY TAT

## 2020-04-15 NOTE — Telephone Encounter (Signed)
Patient called for COVID-19 result.  She was told her test was still pending result and will call back

## 2020-05-29 ENCOUNTER — Other Ambulatory Visit: Payer: Self-pay

## 2020-05-29 ENCOUNTER — Emergency Department
Admission: EM | Admit: 2020-05-29 | Discharge: 2020-05-29 | Disposition: A | Discharge status: Home / Self Care | Payer: Medicaid Other | Attending: Emergency Medicine | Admitting: Emergency Medicine

## 2020-05-29 DIAGNOSIS — R590 Localized enlarged lymph nodes: Secondary | ICD-10-CM | POA: Insufficient documentation

## 2020-05-29 DIAGNOSIS — J020 Streptococcal pharyngitis: Secondary | ICD-10-CM | POA: Insufficient documentation

## 2020-05-29 LAB — GROUP A STREP BY PCR: Group A Strep by PCR: DETECTED — AB

## 2020-05-29 MED ORDER — AMOXICILLIN 500 MG PO CAPS: 500.0000 mg | ORAL_CAPSULE | Freq: Three times a day (TID) | ORAL | 0 refills | Status: AC

## 2020-05-29 MED ORDER — AMOXICILLIN 500 MG PO CAPS
500.0000 mg | ORAL_CAPSULE | Freq: Once | ORAL | Status: AC
  Administered 2020-05-29: 500 mg via ORAL
  Filled 2020-05-29: qty 1

## 2020-05-29 MED ORDER — IBUPROFEN 600 MG PO TABS
600.0000 mg | ORAL_TABLET | Freq: Once | ORAL | Status: AC
  Administered 2020-05-29: 600 mg via ORAL
  Filled 2020-05-29: qty 1

## 2020-05-29 MED ORDER — IBUPROFEN 800 MG PO TABS: 800.0000 mg | ORAL_TABLET | Freq: Three times a day (TID) | ORAL | 0 refills | Status: AC | PRN

## 2020-05-29 NOTE — ED Triage Notes (Signed)
Pt in via pov, pt reports sore throat starting yesterday. Painful when swallowing. Pt denies any difficulty breathing. NAD noted in triage.

## 2020-05-29 NOTE — ED Notes (Signed)
See triage note  Presents with sore throat  States she noticed pain to right side of her throat yesterday  Then states she developed increase pain with swallowing  No fever

## 2020-05-29 NOTE — ED Provider Notes (Signed)
[facility] Emergency Department Provider Note ____________________________________________  Time seen: 0910  I have reviewed the triage vital signs and the nursing notes.  HISTORY  Chief Complaint  Sore Throat   HPI Shannon Ferrell is a 35 y.o. female presents to the ER today with complaint of sore throat.  She reports this started yesterday.  She is having difficulty swallowing.  She denies headache, visual changes, runny nose, nasal congestion, ear pain, loss of taste/smell, cough, shortness of breath, chest pain, nausea, vomiting, diarrhea, fever, chills or body aches.  She has had strep in the past and reports this feels the same.  She has not had known Covid exposure.  She has tried Tylenol OTC with minimal relief of symptoms.  History reviewed. No pertinent past medical history.  There are no problems to display for this patient.   Past Surgical History:  Procedure Laterality Date  . CESAREAN SECTION      Prior to Admission medications   Not on File    Allergies Patient has no known allergies.  History reviewed. No pertinent family history.  Social History Social History   Tobacco Use  . Smoking status: Never Smoker  . Smokeless tobacco: Never Used  Substance Use Topics  . Alcohol use: No  . Drug use: Never    Review of Systems  Constitutional: Negative for fever, chills or body aches. Eyes: Negative for visual changes, eye pain, eye redness or discharge. ENT: Positive for sore throat.  Negative for runny nose, nasal congestion, ear pain, loss of taste/smell. Cardiovascular: Negative for chest pain or chest tightness. Respiratory: Negative for cough or shortness of breath. Gastrointestinal: Negative for abdominal pain, nausea, vomiting and diarrhea. Skin: Negative for rash. Neurological: Negative for headaches, focal weakness or numbness. ____________________________________________  PHYSICAL EXAM:  VITAL SIGNS: ED Triage  Vitals  Enc Vitals Group     BP 05/29/20 0847 115/81     Pulse Rate 05/29/20 0847 76     Resp 05/29/20 0847 18     Temp 05/29/20 0847 98.3 F (36.8 C)     Temp Source 05/29/20 0847 Oral     SpO2 05/29/20 0847 100 %     Weight 05/29/20 0848 129 lb (58.5 kg)     Height 05/29/20 0848 5\' 2"  (1.575 m)     Head Circumference --      Peak Flow --      Pain Score 05/29/20 0848 9     Pain Loc --      Pain Edu? --      Excl. in GC? --     Constitutional: Alert and oriented. Well appearing and in no distress. Head: Normocephalic and atraumatic. Eyes: Conjunctivae are normal. PERRL. Normal extraocular movements Mouth/Throat: Mucous membranes are moist.  Posterior pharynx with erythema but without exudate. Hematological/Lymphatic/Immunological: Bilateral anterior cervical lymphadenopathy. Cardiovascular: Normal rate, regular rhythm.  Respiratory: Normal respiratory effort. No wheezes/rales/rhonchi. Neurologic: Normal speech and language. No gross focal neurologic deficits are appreciated. Skin:  Skin is warm, dry and intact. No rash noted. ____________________________________________   LABS   Labs Reviewed  GROUP A STREP BY PCR - Abnormal; Notable for the following components:      Result Value   Group A Strep by PCR DETECTED (*)    All other components within normal limits    ____________________________________________  INITIAL IMPRESSION / ASSESSMENT AND PLAN / ED COURSE  Sore Throat:  Rapid strep positive No indication for Covid or flu testing Ibuprofen 600 mg p.o.  x1 Amoxicillin 500 mg PO x 1 RX for Amoxicillin 500 mg TID x 10 days RX for Ibuprofen 800 mg TID prn, consume with food ____________________________________________  FINAL CLINICAL IMPRESSION(S) / ED DIAGNOSES  Final diagnoses:  None      Lorre Munroe, NP 05/29/20 1010    Sharyn Creamer, MD 05/29/20 1622

## 2020-05-29 NOTE — Discharge Instructions (Addendum)
You were seen today for sore throat.  Your rapid strep test is positive.  We gave you your first dose of antibiotics in the ER.  I am sending you home with a prescription for amoxicillin 500 mg every 8 hours for 10 days.  You may take ibuprofen 400 to 600 mg every 8 hours as needed with food.  Salt water gargles may be helpful.

## 2020-06-05 ENCOUNTER — Other Ambulatory Visit: Payer: Self-pay

## 2020-10-13 ENCOUNTER — Emergency Department: Payer: Medicaid Other

## 2020-10-13 ENCOUNTER — Encounter: Payer: Self-pay | Admitting: *Deleted

## 2020-10-13 ENCOUNTER — Other Ambulatory Visit: Payer: Self-pay

## 2020-10-13 DIAGNOSIS — R0602 Shortness of breath: Secondary | ICD-10-CM | POA: Insufficient documentation

## 2020-10-13 DIAGNOSIS — R071 Chest pain on breathing: Secondary | ICD-10-CM | POA: Insufficient documentation

## 2020-10-13 LAB — CBC
HCT: 29.4 % — ABNORMAL LOW (ref 36.0–46.0)
Hemoglobin: 9.6 g/dL — ABNORMAL LOW (ref 12.0–15.0)
MCH: 21 pg — ABNORMAL LOW (ref 26.0–34.0)
MCHC: 32.7 g/dL (ref 30.0–36.0)
MCV: 64.2 fL — ABNORMAL LOW (ref 80.0–100.0)
Platelets: 418 10*3/uL — ABNORMAL HIGH (ref 150–400)
RBC: 4.58 MIL/uL (ref 3.87–5.11)
RDW: 19.3 % — ABNORMAL HIGH (ref 11.5–15.5)
WBC: 11.5 10*3/uL — ABNORMAL HIGH (ref 4.0–10.5)
nRBC: 0 % (ref 0.0–0.2)

## 2020-10-13 LAB — POC URINE PREG, ED: Preg Test, Ur: NEGATIVE

## 2020-10-13 NOTE — ED Triage Notes (Signed)
Pt reports she is having a sharp pain in her chest that started this morning lasting a brief period. After lunch, it returned with some nausea. Has been increasing during the day. Worse to take a deep breath.

## 2020-10-14 ENCOUNTER — Emergency Department
Admission: EM | Admit: 2020-10-14 | Discharge: 2020-10-14 | Disposition: A | Discharge status: Home / Self Care | Payer: Medicaid Other | Attending: Emergency Medicine | Admitting: Emergency Medicine

## 2020-10-14 DIAGNOSIS — R0781 Pleurodynia: Secondary | ICD-10-CM

## 2020-10-14 LAB — BASIC METABOLIC PANEL
Anion gap: 6 (ref 5–15)
BUN: 11 mg/dL (ref 6–20)
CO2: 25 mmol/L (ref 22–32)
Calcium: 8.8 mg/dL — ABNORMAL LOW (ref 8.9–10.3)
Chloride: 105 mmol/L (ref 98–111)
Creatinine, Ser: 0.73 mg/dL (ref 0.44–1.00)
GFR, Estimated: >60 mL/min (ref >60)
Glucose, Bld: 87 mg/dL (ref 70–99)
Potassium: 3.5 mmol/L (ref 3.5–5.1)
Sodium: 136 mmol/L (ref 135–145)

## 2020-10-14 LAB — HEPATIC FUNCTION PANEL
ALT: 12 U/L (ref 0–44)
AST: 23 U/L (ref 15–41)
Albumin: 3.8 g/dL (ref 3.5–5.0)
Alkaline Phosphatase: 42 U/L (ref 38–126)
Bilirubin, Direct: <0.1 mg/dL (ref 0.0–0.2)
Total Bilirubin: 0.4 mg/dL (ref 0.3–1.2)
Total Protein: 7.7 g/dL (ref 6.5–8.1)

## 2020-10-14 LAB — LIPASE, BLOOD: Lipase: 40 U/L (ref 11–51)

## 2020-10-14 LAB — D-DIMER, QUANTITATIVE: D-Dimer, Quant: 0.34 ug/mL-FEU (ref 0.00–0.50)

## 2020-10-14 LAB — TROPONIN I (HIGH SENSITIVITY)
Troponin I (High Sensitivity): <2 ng/L (ref <18)
Troponin I (High Sensitivity): <2 ng/L (ref <18)

## 2020-10-14 MED ORDER — KETOROLAC TROMETHAMINE 30 MG/ML IJ SOLN
30.0000 mg | Freq: Once | INTRAMUSCULAR | Status: AC
  Administered 2020-10-14: 30 mg via INTRAVENOUS
  Filled 2020-10-14: qty 1

## 2020-10-14 MED ORDER — NAPROXEN 500 MG PO TABS: 500.0000 mg | ORAL_TABLET | Freq: Two times a day (BID) | ORAL | 0 refills | Status: AC

## 2020-10-14 NOTE — ED Provider Notes (Signed)
[facility] Emergency Department Provider Note  ____________________________________________   Event Date/Time   First MD Initiated Contact with Patient 10/14/20 0321     (approximate)  I have reviewed the triage vital signs and the nursing notes.   HISTORY  Chief Complaint Chest Pain    HPI Shannon Ferrell is a 35 y.o. female with no significant past medical history who presents to the emergency department with severe sharp left-sided chest pain worse with deep inspiration that has been ongoing for 2 days.  Reports associated shortness of breath due to pain.  No fevers, cough, lower extremity swelling or pain.  No history of PE, DVT, exogenous estrogen use, recent fractures, surgery, trauma, hospitalization, prolonged travel or other immobilization.  Has never had similar symptoms.  No injury to her chest wall.  Not reproducible to palpation.         No past medical history on file.  There are no problems to display for this patient.   Past Surgical History:  Procedure Laterality Date   CESAREAN SECTION      Prior to Admission medications   Medication Sig Start Date End Date Taking? Authorizing Provider  naproxen (NAPROSYN) 500 MG tablet Take 1 tablet (500 mg total) by mouth 2 (two) times daily with a meal. Take as needed for pain. 10/14/20 10/14/21 Yes Mandee Pluta, Layla Maw, DO  amoxicillin (AMOXIL) 500 MG capsule Take 1 capsule (500 mg total) by mouth 3 (three) times daily. 05/29/20   Lorre Munroe, NP  ibuprofen (ADVIL) 800 MG tablet Take 1 tablet (800 mg total) by mouth every 8 (eight) hours as needed. 05/29/20   Lorre Munroe, NP    Allergies Patient has no known allergies.  No family history on file.  Social History Social History   Tobacco Use   Smoking status: Never   Smokeless tobacco: Never  Substance Use Topics   Alcohol use: No   Drug use: Never    Review of Systems Constitutional: No fever. Eyes: No visual changes. ENT:  No sore throat. Cardiovascular: + chest pain. Respiratory: + shortness of breath. Gastrointestinal: No nausea, vomiting, diarrhea. Genitourinary: Negative for dysuria. Musculoskeletal: Negative for back pain. Skin: Negative for rash. Neurological: Negative for focal weakness or numbness.  ____________________________________________   PHYSICAL EXAM:  VITAL SIGNS: ED Triage Vitals  Enc Vitals Group     BP 10/13/20 2242 106/63     Pulse Rate 10/13/20 2242 76     Resp 10/13/20 2242 16     Temp 10/13/20 2242 97.9 F (36.6 C)     Temp Source 10/13/20 2242 Oral     SpO2 10/13/20 2242 99 %     Weight --      Height --      Head Circumference --      Peak Flow --      Pain Score 10/13/20 2241 6     Pain Loc --      Pain Edu? --      Excl. in GC? --    CONSTITUTIONAL: Alert and oriented and responds appropriately to questions. Well-appearing; well-nourished HEAD: Normocephalic EYES: Conjunctivae clear, pupils appear equal, EOM appear intact ENT: normal nose; moist mucous membranes NECK: Supple, normal ROM CARD: RRR; S1 and S2 appreciated; no murmurs, no clicks, no rubs, no gallops RESP: Normal chest excursion without splinting or tachypnea; breath sounds clear and equal bilaterally; no wheezes, no rhonchi, no rales, no hypoxia or respiratory distress, speaking full sentences CHEST:  Chest  wall is nontender to palpation.  No crepitus, ecchymosis, erythema, warmth, rash or other lesions present.   ABD/GI: Normal bowel sounds; non-distended; soft, non-tender, no rebound, no guarding, no peritoneal signs, no hepatosplenomegaly BACK: The back appears normal EXT: Normal ROM in all joints; no deformity noted, no edema; no cyanosis, no calf tenderness or calf swelling SKIN: Normal color for age and race; warm; no rash on exposed skin NEURO: Moves all extremities equally PSYCH: The patient's mood and manner are appropriate.  ____________________________________________   LABS (all  labs ordered are listed, but only abnormal results are displayed)  Labs Reviewed  BASIC METABOLIC PANEL - Abnormal; Notable for the following components:      Result Value   Calcium 8.8 (*)    All other components within normal limits  CBC - Abnormal; Notable for the following components:   WBC 11.5 (*)    Hemoglobin 9.6 (*)    HCT 29.4 (*)    MCV 64.2 (*)    MCH 21.0 (*)    RDW 19.3 (*)    Platelets 418 (*)    All other components within normal limits  LIPASE, BLOOD  HEPATIC FUNCTION PANEL  D-DIMER, QUANTITATIVE  POC URINE PREG, ED  TROPONIN I (HIGH SENSITIVITY)  TROPONIN I (HIGH SENSITIVITY)   ____________________________________________  EKG   EKG Interpretation  Date/Time:  Tuesday October 13 2020 22:44:18 EDT Ventricular Rate:  71 PR Interval:  146 QRS Duration: 84 QT Interval:  392 QTC Calculation: 425 R Axis:   59 Text Interpretation: Normal sinus rhythm Normal ECG Confirmed by Rochele Raring [phone]) on 10/14/2020 3:39:36 AM         ____________________________________________  RADIOLOGY Normajean Baxter Merlin Golden, personally viewed and evaluated these images (plain radiographs) as part of my medical decision making, as well as reviewing the written report by the radiologist.  ED MD interpretation: Chest x-ray clear.  Official radiology report(s): DG Chest 2 View  Result Date: 10/13/2020 CLINICAL DATA:  Sharp chest pain EXAM: CHEST - 2 VIEW COMPARISON:  Radiograph 07/03/2014 FINDINGS: No consolidation, features of edema, pneumothorax, or effusion. Pulmonary vascularity is normally distributed. The cardiomediastinal contours are unremarkable. No acute osseous or soft tissue abnormality. Metallic left nipple ornamentation. IMPRESSION: No active cardiopulmonary disease. Electronically Signed   By: Kreg Shropshire M.D.   On: 10/13/2020 23:09    ____________________________________________   PROCEDURES  Procedure(s) performed (including Critical  Care):  Procedures   ____________________________________________   INITIAL IMPRESSION / ASSESSMENT AND PLAN / ED COURSE  As part of my medical decision making, I reviewed the following data within the electronic MEDICAL RECORD NUMBER Nursing notes reviewed and incorporated, Labs reviewed , EKG interpreted , Old EKG reviewed, Radiograph reviewed , and Notes from prior ED visits         Patient here with pleuritic chest pain.  EKG nonischemic.  Troponin x2 normal.  Very low risk for ACS.  Doubt dissection.  No risk factors for PE but will obtain D-dimer given pleuritic chest pain that is not reproducible with palpation.  We did discuss that this could be musculoskeletal in nature.  Will give Toradol and reassess.  Chest x-ray shows no edema, effusion, pneumothorax, infiltrate.  ED PROGRESS  D-dimer negative.  Patient reports significant improvement in pain after Toradol.  She is comfortable with plan for discharge home.  Will discharge with naproxen.  At this time, I do not feel there is any life-threatening condition present. I have reviewed, interpreted and discussed all results (EKG, imaging,  lab, urine as appropriate) and exam findings with patient/family. I have reviewed nursing notes and appropriate previous records.  I feel the patient is safe to be discharged home without further emergent workup and can continue workup as an outpatient as needed. Discussed usual and customary return precautions. Patient/family verbalize understanding and are comfortable with this plan.  Outpatient follow-up has been provided as needed. All questions have been answered.  ____________________________________________   FINAL CLINICAL IMPRESSION(S) / ED DIAGNOSES  Final diagnoses:  Pleuritic chest pain     ED Discharge Orders          Ordered    naproxen (NAPROSYN) 500 MG tablet  2 times daily with meals        10/14/20 0510            *Please note:  Zauria Dombek was evaluated in  Emergency Department on 10/14/2020 for the symptoms described in the history of present illness. She was evaluated in the context of the global COVID-19 pandemic, which necessitated consideration that the patient might be at risk for infection with the SARS-CoV-2 virus that causes COVID-19. Institutional protocols and algorithms that pertain to the evaluation of patients at risk for COVID-19 are in a state of rapid change based on information released by regulatory bodies including the CDC and federal and state organizations. These policies and algorithms were followed during the patient's care in the ED.  Some ED evaluations and interventions may be delayed as a result of limited staffing during and the pandemic.*   Note:  This document was prepared using Dragon voice recognition software and may include unintentional dictation errors.    Nialah Saravia, Layla Maw, DO 10/14/20 [phone]

## 2022-12-02 IMAGING — CR DG CHEST 2V
2 series · 2 of 2 positions shown · non-contrast
Comparison: Radiograph 07/03/2014

CLINICAL DATA: Sharp chest pain

EXAM:
CHEST - 2 VIEW

[chest pa]
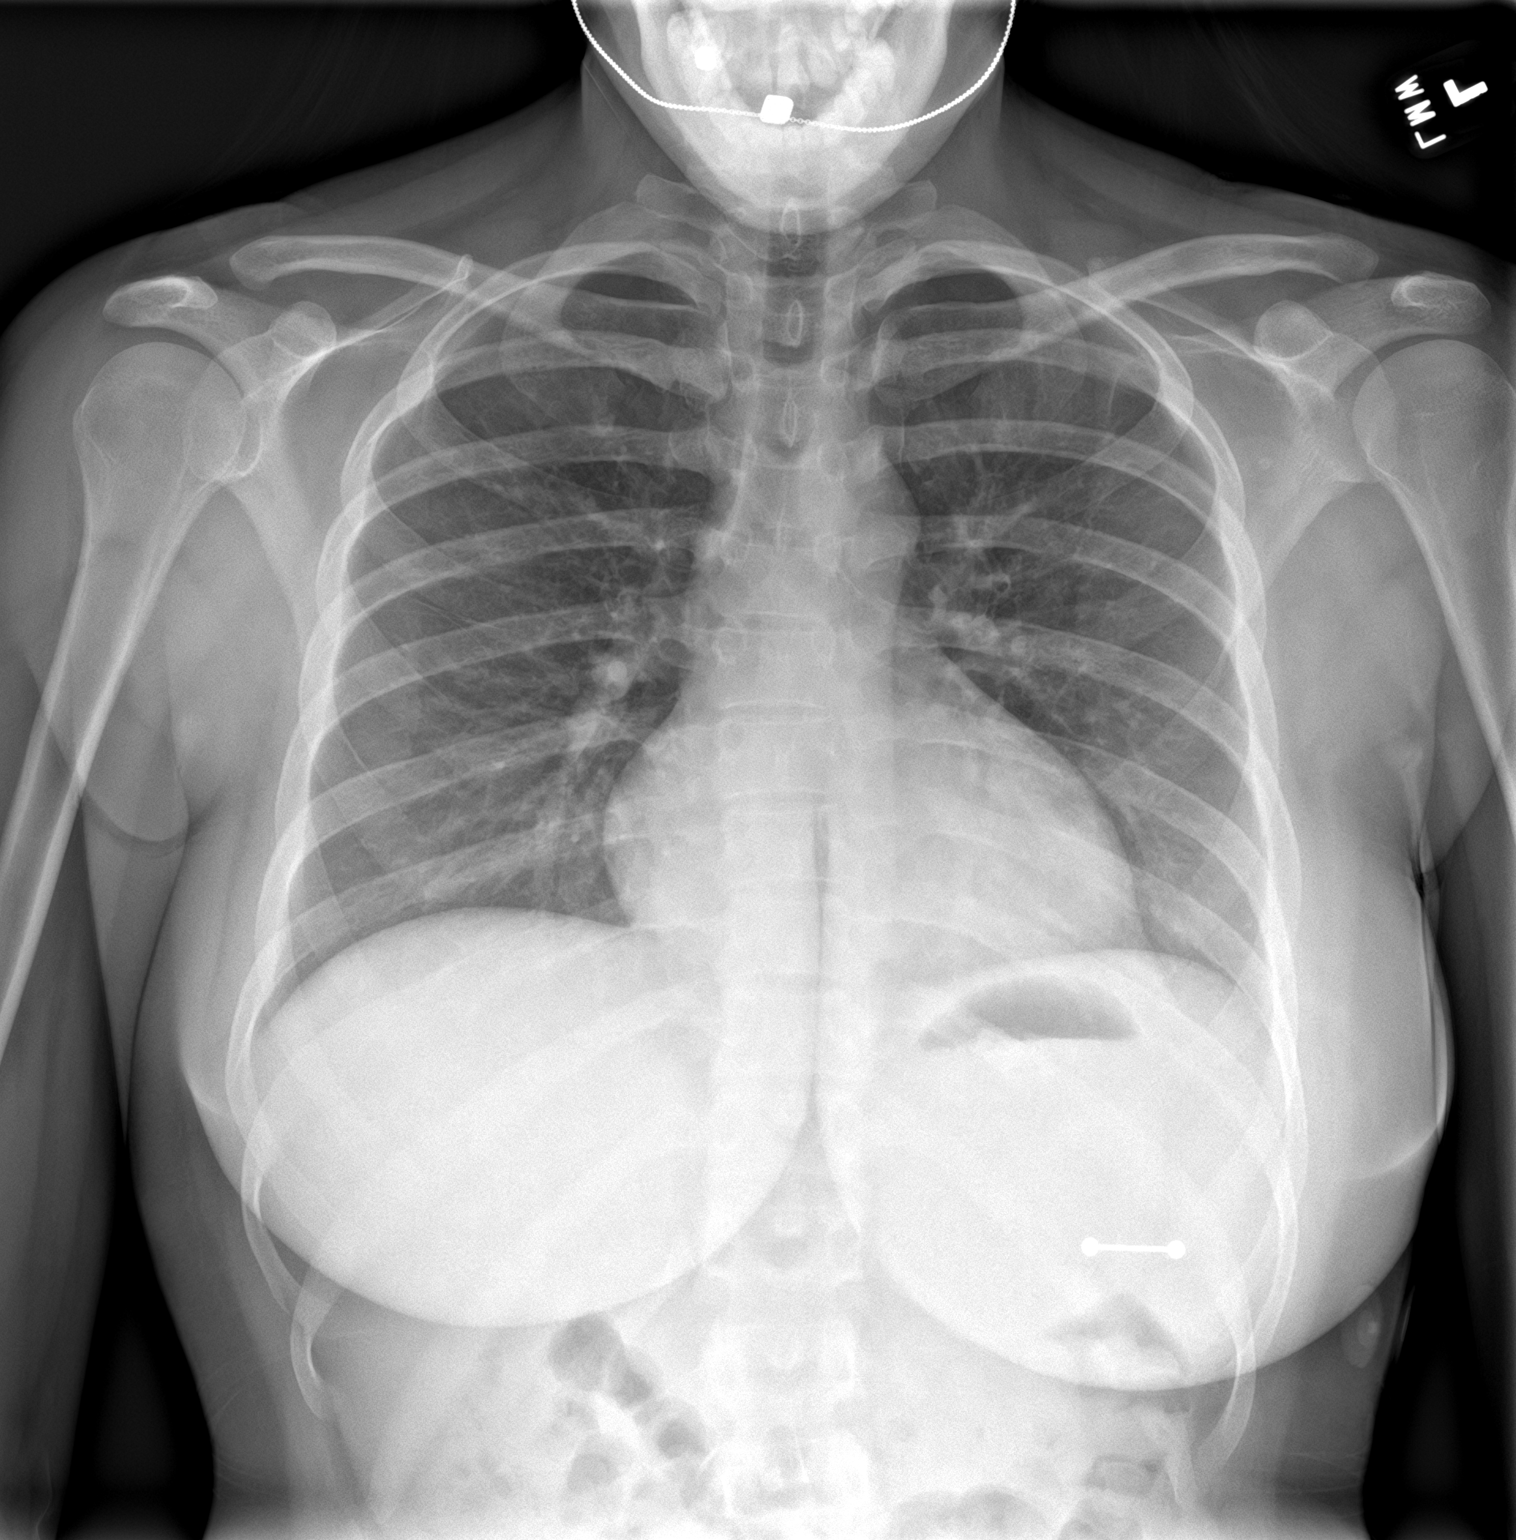

[chest lat]
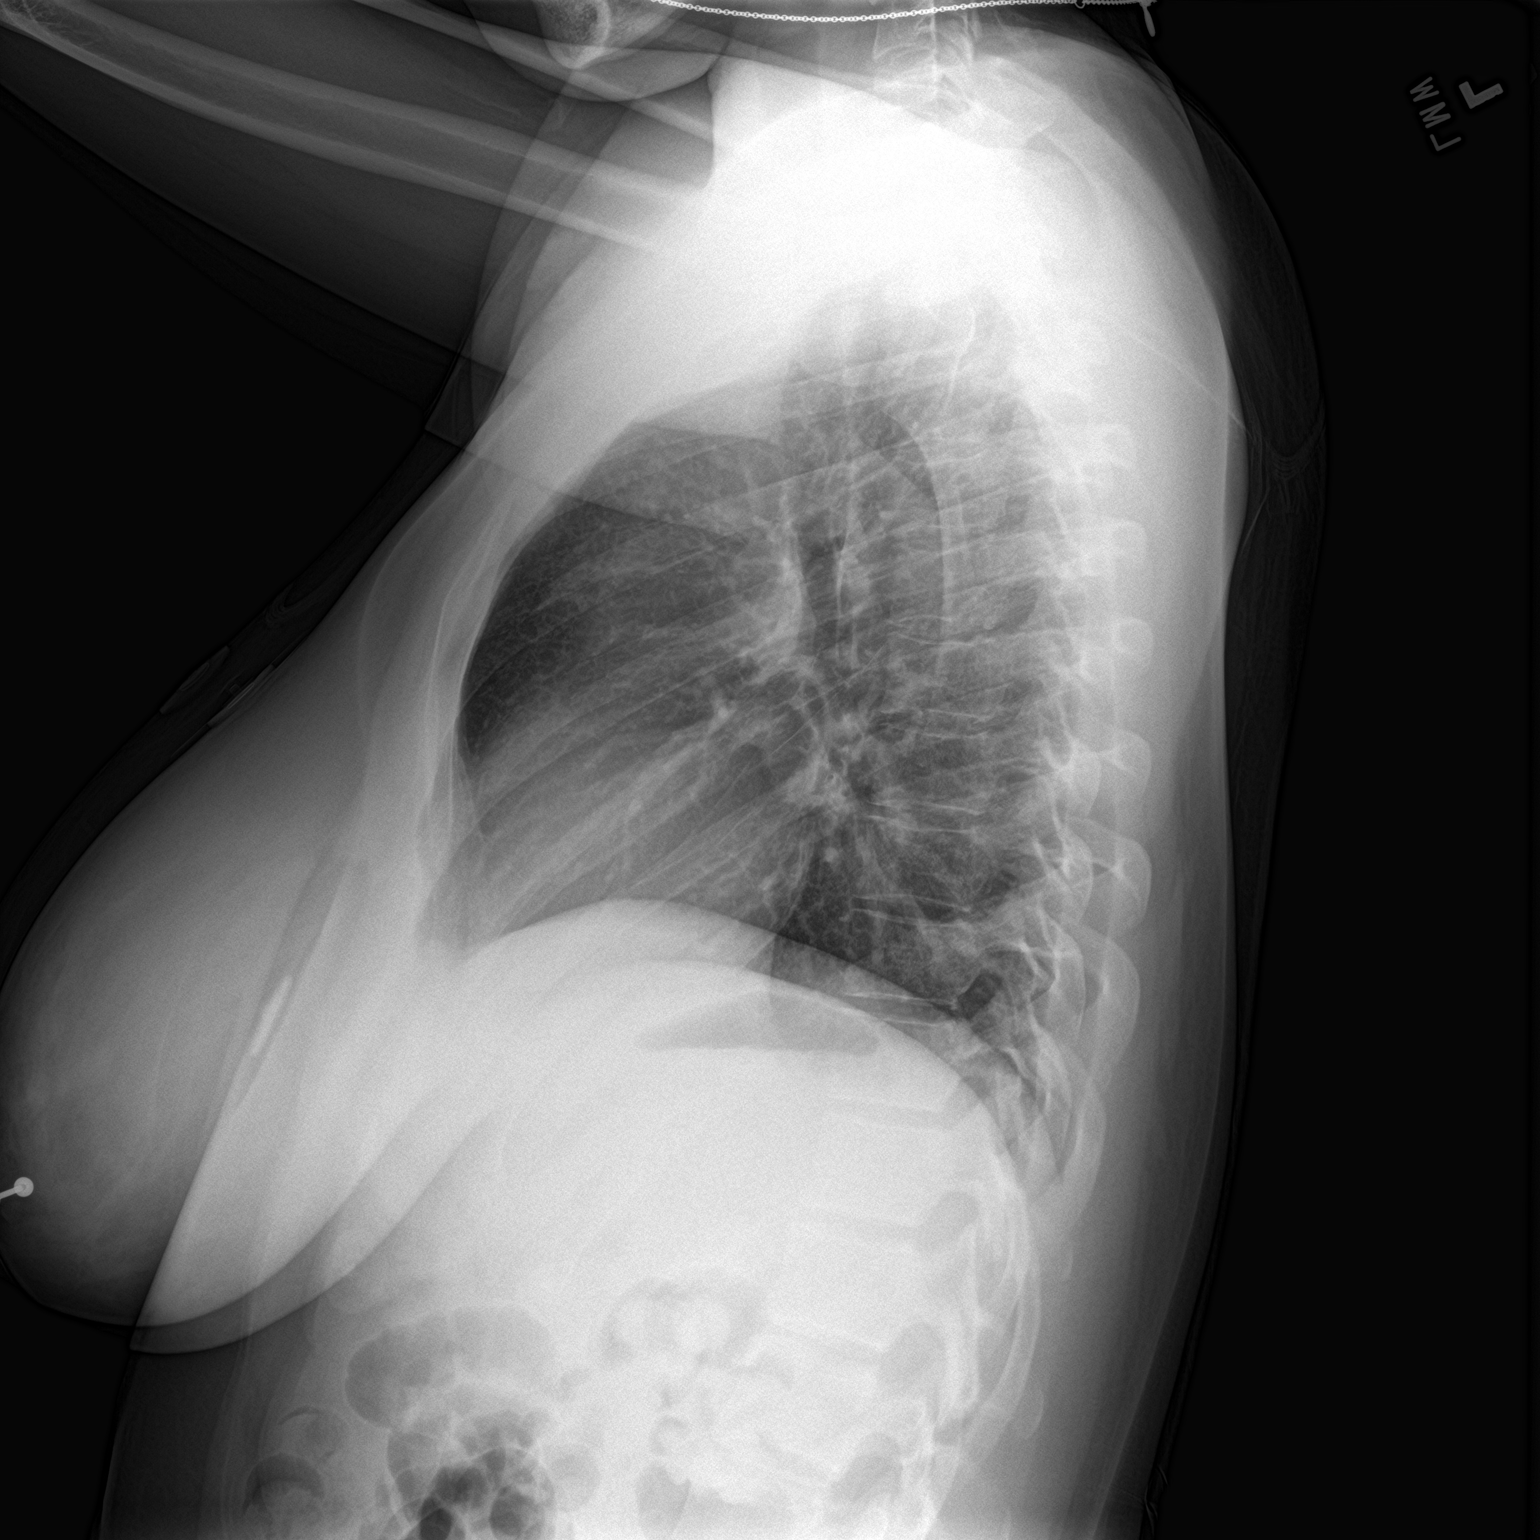

[2 of 2 positions shown; findings below may reference images not displayed]

FINDINGS: No consolidation, features of edema, pneumothorax, or effusion.
Pulmonary vascularity is normally distributed. The cardiomediastinal
contours are unremarkable. No acute osseous or soft tissue
abnormality. Metallic left nipple ornamentation.
IMPRESSION: No active cardiopulmonary disease.

## 2023-02-28 ENCOUNTER — Emergency Department: Payer: Self-pay

## 2023-02-28 ENCOUNTER — Other Ambulatory Visit: Payer: Self-pay

## 2023-02-28 ENCOUNTER — Emergency Department
Admission: EM | Admit: 2023-02-28 | Discharge: 2023-02-28 | Disposition: A | Discharge status: Home / Self Care | Payer: Self-pay | Attending: Emergency Medicine | Admitting: Emergency Medicine

## 2023-02-28 DIAGNOSIS — R0789 Other chest pain: Secondary | ICD-10-CM | POA: Insufficient documentation

## 2023-02-28 LAB — CBC
HCT: 33.4 % — ABNORMAL LOW (ref 36.0–46.0)
Hemoglobin: 11.2 g/dL — ABNORMAL LOW (ref 12.0–15.0)
MCH: 25.5 pg — ABNORMAL LOW (ref 26.0–34.0)
MCHC: 33.5 g/dL (ref 30.0–36.0)
MCV: 75.9 fL — ABNORMAL LOW (ref 80.0–100.0)
Platelets: 455 10*3/uL — ABNORMAL HIGH (ref 150–400)
RBC: 4.4 MIL/uL (ref 3.87–5.11)
RDW: 15.1 % (ref 11.5–15.5)
WBC: 10.6 10*3/uL — ABNORMAL HIGH (ref 4.0–10.5)
nRBC: 0 % (ref 0.0–0.2)

## 2023-02-28 LAB — BASIC METABOLIC PANEL
Anion gap: 7 (ref 5–15)
BUN: 12 mg/dL (ref 6–20)
CO2: 26 mmol/L (ref 22–32)
Calcium: 8.8 mg/dL — ABNORMAL LOW (ref 8.9–10.3)
Chloride: 104 mmol/L (ref 98–111)
Creatinine, Ser: 0.88 mg/dL (ref 0.44–1.00)
GFR, Estimated: >60 mL/min (ref >60)
Glucose, Bld: 92 mg/dL (ref 70–99)
Potassium: 3.8 mmol/L (ref 3.5–5.1)
Sodium: 137 mmol/L (ref 135–145)

## 2023-02-28 LAB — TROPONIN I (HIGH SENSITIVITY): Troponin I (High Sensitivity): 2 ng/L (ref <18)

## 2023-02-28 MED ORDER — ACETAMINOPHEN 325 MG PO TABS
650.0000 mg | ORAL_TABLET | Freq: Once | ORAL | Status: AC
  Administered 2023-02-28: 650 mg via ORAL
  Filled 2023-02-28: qty 2

## 2023-02-28 MED ORDER — IOHEXOL 350 MG/ML SOLN
75.0000 mL | Freq: Once | INTRAVENOUS | Status: AC | PRN
  Administered 2023-02-28: 75 mL via INTRAVENOUS

## 2023-02-28 NOTE — ED Triage Notes (Signed)
Pt to ED via POV from home. Pt reports left sided CP that radiates to her back x2-3 days. Pt also reports intermittent HA. Pt denies N/V/D, cough, fever or SOB.

## 2023-02-28 NOTE — ED Notes (Signed)
See triage notes. Patient c/o left sided chest pain that radiates to her back times two to three days.

## 2023-02-28 NOTE — ED Provider Notes (Signed)
[facility] Provider Note    Event Date/Time   First MD Initiated Contact with Patient 02/28/23 1706     (approximate)   History   Chest Pain   HPI  Shannon Ferrell is a 37 y.o. female who presents with complaints of left upper chest pain which she reports has been present for about 3 days, she reports it is constant achy pain and she is having pleurisy.  Occasionally she has pain in her back as well.  No shortness of breath reported.  No cough fevers or chills     Physical Exam   Triage Vital Signs: ED Triage Vitals  Encounter Vitals Group     BP 02/28/23 1643 127/74     Systolic BP Percentile --      Diastolic BP Percentile --      Pulse Rate 02/28/23 1643 (!) 59     Resp 02/28/23 1643 18     Temp 02/28/23 1643 98.1 F (36.7 C)     Temp Source 02/28/23 1643 Oral     SpO2 02/28/23 1643 100 %     Weight --      Height --      Head Circumference --      Peak Flow --      Pain Score 02/28/23 1638 5     Pain Loc --      Pain Education --      Exclude from Growth Chart --     Most recent vital signs: Vitals:   02/28/23 1643 02/28/23 2003  BP: 127/74 120/86  Pulse: (!) 59 80  Resp: 18 17  Temp: 98.1 F (36.7 C) 97.8 F (36.6 C)  SpO2: 100% 100%     General: Awake, no distress.  CV:  Good peripheral perfusion.  Resp:  Normal effort.  Clear to auscultation bilaterally Abd:  No distention.  Other:     ED Results / Procedures / Treatments   Labs (all labs ordered are listed, but only abnormal results are displayed) Labs Reviewed  BASIC METABOLIC PANEL - Abnormal; Notable for the following components:      Result Value   Calcium 8.8 (*)    All other components within normal limits  CBC - Abnormal; Notable for the following components:   WBC 10.6 (*)    Hemoglobin 11.2 (*)    HCT 33.4 (*)    MCV 75.9 (*)    MCH 25.5 (*)    Platelets 455 (*)    All other components within normal limits  POC URINE PREG, ED  TROPONIN I  (HIGH SENSITIVITY)     EKG  ED ECG REPORT I, Jene Every, the attending physician, personally viewed and interpreted this ECG.  Date: 02/28/2023  Rhythm: normal sinus rhythm QRS Axis: normal Intervals: normal ST/T Wave abnormalities: normal Narrative Interpretation: no evidence of acute ischemia    RADIOLOGY Chest x-ray viewed interpret by me, no acute abnormality    PROCEDURES:  Critical Care performed:   Procedures   MEDICATIONS ORDERED IN ED: Medications  iohexol (OMNIPAQUE) 350 MG/ML injection 75 mL (75 mLs Intravenous Contrast Given 02/28/23 1849)  acetaminophen (TYLENOL) tablet 650 mg (650 mg Oral Given 02/28/23 2007)     IMPRESSION / MDM / ASSESSMENT AND PLAN / ED COURSE  I reviewed the triage vital signs and the nursing notes. Patient's presentation is most consistent with acute presentation with potential threat to life or bodily function.  Patient presents with chest pain as detailed above.  Doubt ACS, high sensitive troponin, EKG are reassuring.  She does have pleurisy and constant chest pain suspicious for possible PE, will send for CT angiography.  No evidence of pneumonia on chest x-ray  CT angiography is negative for PE, patient is reassured, appropriate for discharge at this time with outpatient follow-up with cardiology, return precautions discussed      FINAL CLINICAL IMPRESSION(S) / ED DIAGNOSES   Final diagnoses:  Atypical chest pain     Rx / DC Orders   ED Discharge Orders          Ordered    Ambulatory referral to Cardiology       Comments: If you have not heard from the Cardiology office within the next 72 hours please call [phone].   02/28/23 2024             Note:  This document was prepared using Dragon voice recognition software and may include unintentional dictation errors.   Jene Every, MD 02/28/23 2030
